# Patient Record
Sex: Male | Born: 1975 | Race: Black or African American | Hispanic: No | Marital: Single | Smoking: Current every day smoker
Health system: Southern US, Community
[De-identification: ages and names within clinical notes are randomized; demographics above are authoritative.]

## PROBLEM LIST (undated history)

## (undated) DIAGNOSIS — Z7289 Other problems related to lifestyle: Secondary | ICD-10-CM

## (undated) DIAGNOSIS — F319 Bipolar disorder, unspecified: Secondary | ICD-10-CM

## (undated) DIAGNOSIS — F4310 Post-traumatic stress disorder, unspecified: Secondary | ICD-10-CM

## (undated) HISTORY — PX: HIP SURGERY: SHX245

---

## 2015-05-05 ENCOUNTER — Encounter (HOSPITAL_COMMUNITY): Payer: Self-pay | Admitting: Emergency Medicine

## 2015-05-05 ENCOUNTER — Emergency Department (INDEPENDENT_AMBULATORY_CARE_PROVIDER_SITE_OTHER)
Admission: EM | Admit: 2015-05-05 | Discharge: 2015-05-05 | Disposition: A | Discharge status: Home / Self Care | Payer: PRIVATE HEALTH INSURANCE | Source: Home / Self Care | Attending: Family Medicine | Admitting: Family Medicine

## 2015-05-05 ENCOUNTER — Emergency Department (INDEPENDENT_AMBULATORY_CARE_PROVIDER_SITE_OTHER): Payer: PRIVATE HEALTH INSURANCE

## 2015-05-05 DIAGNOSIS — S63502A Unspecified sprain of left wrist, initial encounter: Secondary | ICD-10-CM
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
## 2015-05-05 NOTE — Discharge Instructions (Signed)
Wear splint and see orthopedist next week for recheck

## 2015-05-05 NOTE — ED Provider Notes (Signed)
CSN: [id number]     Arrival date & time 05/05/15  1642 History   First MD Initiated Contact with Patient 05/05/15 1722     Chief Complaint  Patient presents with  . Wrist Injury   (Consider location/radiation/quality/duration/timing/severity/associated sxs/prior Treatment) Patient is a 39 y.o. male presenting with wrist injury. The history is provided by the patient.  Wrist Injury Location:  Wrist Time since incident:  1 week Injury: yes   Mechanism of injury comment:  Fx in 11/2014, fell on trampoline and now with recurrent pain Wrist location:  L wrist   History reviewed. No pertinent past medical history. History reviewed. No pertinent past surgical history. History reviewed. No pertinent family history. Social History  Substance Use Topics  . Smoking status: Current Every Day Smoker -- 1.00 packs/day    Types: Cigarettes  . Smokeless tobacco: Never Used  . Alcohol Use: No    Review of Systems  Musculoskeletal: Negative for joint swelling.    Allergies  Review of patient's allergies indicates no known allergies.  Home Medications   Prior to Admission medications   Medication Sig Start Date End Date Taking? Authorizing Provider  acetaminophen (TYLENOL) 500 MG chewable tablet Chew 1,000 mg by mouth every 6 (six) hours as needed for pain.   Yes Historical Provider, MD   BP 142/88 mmHg  Pulse 60  Temp(Src) 99.1 F (37.3 C) (Oral)  Resp 18  SpO2 98% Physical Exam  Constitutional: He is oriented to person, place, and time. He appears well-developed and well-nourished. He appears distressed.  Musculoskeletal: He exhibits tenderness.       Left wrist: He exhibits decreased range of motion, tenderness, bony tenderness and swelling. He exhibits no deformity.  Neurological: He is alert and oriented to person, place, and time.  Skin: Skin is warm and dry.  Nursing note and vitals reviewed.   ED Course  Procedures (including critical care time) Labs Review Labs  Reviewed - No data to display  Imaging Review Dg Wrist Complete Left  05/05/2015   CLINICAL DATA:  History of remote trauma and acute trauma involving the left wrist.  EXAM: LEFT WRIST - COMPLETE 3+ VIEW  COMPARISON:  None.  FINDINGS: There is a healed or healing fracture of the distal radius. There is also a an ununited ulnar styloid fracture. No obvious acute fracture. The carpal bones are intact. The intercarpal joint spaces are maintained.  IMPRESSION: Healed or healing distal radius fracture and ununited ulnar styloid fracture.   Electronically Signed   By: Rudie Meyer M.D.   On: 05/05/2015 17:53     MDM   1. Sprain of wrist, left, initial encounter        Linna Hoff, MD 05/05/15 [phone]

## 2015-05-10 ENCOUNTER — Emergency Department (HOSPITAL_COMMUNITY): Payer: Medicaid - Out of State

## 2015-05-10 ENCOUNTER — Encounter (HOSPITAL_COMMUNITY): Payer: Self-pay | Admitting: Nurse Practitioner

## 2015-05-10 ENCOUNTER — Emergency Department (HOSPITAL_COMMUNITY)
Admission: EM | Admit: 2015-05-10 | Discharge: 2015-05-10 | Disposition: A | Discharge status: Home / Self Care | Payer: Medicaid - Out of State | Attending: Emergency Medicine | Admitting: Emergency Medicine

## 2015-05-10 DIAGNOSIS — Z720 Tobacco use: Secondary | ICD-10-CM | POA: Diagnosis not present

## 2015-05-10 DIAGNOSIS — M25532 Pain in left wrist: Secondary | ICD-10-CM | POA: Diagnosis not present

## 2015-05-10 MED ORDER — NAPROXEN 250 MG PO TABS: 250.0000 mg | ORAL_TABLET | Freq: Two times a day (BID) | ORAL | Status: DC

## 2015-05-10 NOTE — Discharge Instructions (Signed)
Wrist Pain °Wrist injuries are frequent in adults and children. A sprain is an injury to the ligaments that hold your bones together. A strain is an injury to muscle or muscle cord-like structures (tendons) from stretching or pulling. Generally, when wrists are moderately tender to touch following a fall or injury, a break in the bone (fracture) may be present. Most wrist sprains or strains are better in 3 to 5 days, but complete healing may take several weeks. °HOME CARE INSTRUCTIONS  °· Put ice on the injured area. °· Put ice in a plastic bag. °· Place a towel between your skin and the bag. °· Leave the ice on for 15-20 minutes, 3-4 times a day, for the first 2 days, or as directed by your health care provider. °· Keep your arm raised above the level of your heart whenever possible to reduce swelling and pain. °· Rest the injured area for at least 48 hours or as directed by your health care provider. °· If a splint or elastic bandage has been applied, use it for as long as directed by your health care provider or until seen by a health care provider for a follow-up exam. °· Only take over-the-counter or prescription medicines for pain, discomfort, or fever as directed by your health care provider. °· Keep all follow-up appointments. You may need to follow up with a specialist or have follow-up X-rays. Improvement in pain level is not a guarantee that you did not fracture a bone in your wrist. The only way to determine whether or not you have a broken bone is by X-ray. °SEEK IMMEDIATE MEDICAL CARE IF:  °· Your fingers are swollen, very red, white, or cold and blue. °· Your fingers are numb or tingling. °· You have increasing pain. °· You have difficulty moving your fingers. °MAKE SURE YOU:  °· Understand these instructions. °· Will watch your condition. °· Will get help right away if you are not doing well or get worse. °Document Released: 06/13/2005 Document Revised: 09/08/2013 Document Reviewed:  10/25/2010 °ExitCare® Patient Information ©2015 [employer]. This information is not intended to replace advice given to you by your health care provider. Make sure you discuss any questions you have with your health care provider. °Wrist Splint °A wrist splint is a brace that holds your wrist in a fixed position. It can be used to stabilize your wrist so that broken bones and sprains can heal faster, with less pain. It can also help to relieve pressure on the nerve that runs down the middle of your arm (median nerve). °Splints are available in drugstores without a prescription. They are also available by prescription from orthopedic and medical supply stores. Custom splints made from lightweight materials can be made by physical or occupational therapist. °HOME CARE INSTRUCTIONS °· Wear your splint as instructed by your caregiver. It may be worn while you sleep. °· Your caregiver may instruct you how to perform certain exercises at home. These exercises help maintain muscle strength in your hand and wrist. They also help to maintain motion in your fingers. °SEEK MEDICAL CARE IF: °· You start to lose feeling in your hand or fingers. °· Your skin or fingernails turn blue or gray, or they feel cold. °MAKE SURE YOU:  °· Understand these instructions. °· Will watch your condition. °· Will get help right away if you are not doing well or get worse. °Document Released: 08/16/2006 Document Revised: 11/26/2011 Document Reviewed: 12/15/2013 °ExitCare® Patient Information ©2015 [employer]. This information is   not intended to replace advice given to you by your health care provider. Make sure you discuss any questions you have with your health care provider. ° °

## 2015-05-10 NOTE — ED Notes (Signed)
Dr. Gramig in to see pt 

## 2015-05-10 NOTE — ED Notes (Signed)
He was seen at [facility] earlier this week for L wrist fx, referred to Dr Amanda Pea for follow up. He can not go there due to insurance. He called dr Brunetta Genera office and they instructed him to come to ED for further management of injury
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
## 2015-11-07 ENCOUNTER — Emergency Department (HOSPITAL_COMMUNITY)
Admission: EM | Admit: 2015-11-07 | Discharge: 2015-11-08 | Disposition: A | Discharge status: Home / Self Care | Payer: PRIVATE HEALTH INSURANCE | Attending: Emergency Medicine | Admitting: Emergency Medicine

## 2015-11-07 ENCOUNTER — Encounter (HOSPITAL_COMMUNITY): Payer: Self-pay

## 2015-11-07 DIAGNOSIS — F1210 Cannabis abuse, uncomplicated: Secondary | ICD-10-CM | POA: Insufficient documentation

## 2015-11-07 DIAGNOSIS — F329 Major depressive disorder, single episode, unspecified: Secondary | ICD-10-CM | POA: Insufficient documentation

## 2015-11-07 DIAGNOSIS — F1994 Other psychoactive substance use, unspecified with psychoactive substance-induced mood disorder: Secondary | ICD-10-CM | POA: Diagnosis not present

## 2015-11-07 DIAGNOSIS — F1910 Other psychoactive substance abuse, uncomplicated: Secondary | ICD-10-CM

## 2015-11-07 DIAGNOSIS — F1410 Cocaine abuse, uncomplicated: Secondary | ICD-10-CM | POA: Diagnosis not present

## 2015-11-07 DIAGNOSIS — F17210 Nicotine dependence, cigarettes, uncomplicated: Secondary | ICD-10-CM | POA: Insufficient documentation

## 2015-11-07 DIAGNOSIS — F32A Depression, unspecified: Secondary | ICD-10-CM

## 2015-11-07 DIAGNOSIS — Z008 Encounter for other general examination: Secondary | ICD-10-CM | POA: Diagnosis present

## 2015-11-07 HISTORY — DX: Other problems related to lifestyle: Z72.89

## 2015-11-07 HISTORY — DX: Post-traumatic stress disorder, unspecified: F43.10

## 2015-11-07 HISTORY — DX: Bipolar disorder, unspecified: F31.9

## 2015-11-07 LAB — CBC
HEMATOCRIT: 45.4 % (ref 39.0–52.0)
HEMOGLOBIN: 14.6 g/dL (ref 13.0–17.0)
MCH: 28.7 pg (ref 26.0–34.0)
MCHC: 32.2 g/dL (ref 30.0–36.0)
MCV: 89.4 fL (ref 78.0–100.0)
Platelets: 199 10*3/uL (ref 150–400)
RBC: 5.08 MIL/uL (ref 4.22–5.81)
RDW: 13.3 % (ref 11.5–15.5)
WBC: 4.7 10*3/uL (ref 4.0–10.5)

## 2015-11-07 LAB — ETHANOL: Alcohol, Ethyl (B): <5 mg/dL (ref <5)

## 2015-11-07 LAB — SALICYLATE LEVEL: Salicylate Lvl: <4 mg/dL (ref 2.8–30.0)

## 2015-11-07 LAB — RAPID URINE DRUG SCREEN, HOSP PERFORMED
AMPHETAMINES: NOT DETECTED
BARBITURATES: NOT DETECTED
Benzodiazepines: NOT DETECTED
Cocaine: POSITIVE — AB
Opiates: NOT DETECTED
TETRAHYDROCANNABINOL: POSITIVE — AB

## 2015-11-07 LAB — COMPREHENSIVE METABOLIC PANEL
ALK PHOS: 50 U/L (ref 38–126)
ALT: 17 U/L (ref 17–63)
AST: 18 U/L (ref 15–41)
Albumin: 4.1 g/dL (ref 3.5–5.0)
Anion gap: 9 (ref 5–15)
BUN: 17 mg/dL (ref 6–20)
CALCIUM: 9.3 mg/dL (ref 8.9–10.3)
CHLORIDE: 107 mmol/L (ref 101–111)
CO2: 26 mmol/L (ref 22–32)
CREATININE: 1.17 mg/dL (ref 0.61–1.24)
Glucose, Bld: 101 mg/dL — ABNORMAL HIGH (ref 65–99)
Potassium: 3.9 mmol/L (ref 3.5–5.1)
SODIUM: 142 mmol/L (ref 135–145)
Total Bilirubin: 0.6 mg/dL (ref 0.3–1.2)
Total Protein: 7.3 g/dL (ref 6.5–8.1)

## 2015-11-07 LAB — ACETAMINOPHEN LEVEL: Acetaminophen (Tylenol), Serum: <10 ug/mL — ABNORMAL LOW (ref 10–30)

## 2015-11-07 MED ORDER — ONDANSETRON HCL 4 MG PO TABS: 4.0000 mg | ORAL_TABLET | Freq: Three times a day (TID) | ORAL | Status: DC | PRN

## 2015-11-07 MED ORDER — LORAZEPAM 1 MG PO TABS: 1.0000 mg | ORAL_TABLET | Freq: Three times a day (TID) | ORAL | Status: DC | PRN

## 2015-11-07 MED ORDER — IBUPROFEN 200 MG PO TABS: 600.0000 mg | ORAL_TABLET | Freq: Three times a day (TID) | ORAL | Status: DC | PRN

## 2015-11-07 NOTE — ED Notes (Signed)
Glasses with patient

## 2015-11-07 NOTE — ED Notes (Signed)
Bed: [facility] Expected date: 11/07/15 Expected time: 9:52 PM Means of arrival:  Comments: Triage 6

## 2015-11-07 NOTE — ED Notes (Addendum)
According to representative with [facility], pt has stated within the last 24hr. Impulsive SI and HI. Pt denies a specific SI plan.  Pt recently experienced loss of his twin children. Pt reports hearing voices instructing him to hurt other individuals. Pt is A+OX4, speaking in complete sentences, calm and cooperative. Pt reports consuming Marijuana, MDMA, Cocaine within the last 48hrs.

## 2015-11-07 NOTE — ED Provider Notes (Signed)
CSN: [id number]     Arrival date & time 11/07/15  1856 History   First MD Initiated Contact with Patient 11/07/15 2027     Chief Complaint  Patient presents with  . Psychiatric Evaluation     (Consider location/radiation/quality/duration/timing/severity/associated sxs/prior Treatment) HPI   40 year old male who presented for psychiatric evaluation. Multiple complaints. Patient reports he was incarcerated in [place]. During his incarceration, his sisters died and one of his other brothers was also incarcerated for a prolonged period of time. Both were individuals he relied on for emotional support.  He also reports he was going to have twin boys but they died prematurely. He moved down to [place] about 3 weeks ago to be with other family members. Living with his twin brother, but he has a tumultuous relationship with him. He feels like he has been becoming increasingly out of control. Will get angry with very little provocation. Self-medicating with cocaine, marijuana and Molly. Denies alcohol use. Reports a past history of depression and bipolar disorder. Previously on medications when getting care in [place] and [place]. He has no local mental health care. Denies SI or HI to me, but reports that he can be very impulsive at times and is afraid he may potentially harm someone or himself if he doesn't get help.    Past Medical History  Diagnosis Date  . Bipolar disorder (HCC)   . Self-mutilation   . PTSD (post-traumatic stress disorder)    Past Surgical History  Procedure Laterality Date  . Hip surgery     No family history on file. Social History  Substance Use Topics  . Smoking status: Current Every Day Smoker -- 1.00 packs/day    Types: Cigarettes  . Smokeless tobacco: Never Used  . Alcohol Use: No    Review of Systems  All systems reviewed and negative, other than as noted in HPI.   Allergies  Review of patient's allergies indicates no known allergies.  Home  Medications   Prior to Admission medications   Medication Sig Start Date End Date Taking? Authorizing Provider  naproxen (NAPROSYN) 250 MG tablet Take 1 tablet (250 mg total) by mouth 2 (two) times daily with a meal. Patient not taking: Reported on 11/07/2015 05/10/15   Everlene Farrier, PA-C   BP 134/92 mmHg  Pulse 68  Temp(Src) 98.2 F (36.8 C) (Oral)  Resp 15  SpO2 98% Physical Exam  Constitutional: He appears well-developed and well-nourished. No distress.  HENT:  Head: Normocephalic and atraumatic.  Eyes: Conjunctivae are normal. Right eye exhibits no discharge. Left eye exhibits no discharge.  Neck: Neck supple.  Cardiovascular: Normal rate, regular rhythm and normal heart sounds.  Exam reveals no gallop and no friction rub.   No murmur heard. Pulmonary/Chest: Effort normal and breath sounds normal. No respiratory distress.  Abdominal: Soft. He exhibits no distension. There is no tenderness.  Musculoskeletal: He exhibits no edema or tenderness.  Neurological: He is alert.  Skin: Skin is warm and dry.  Psychiatric: He has a normal mood and affect. His behavior is normal. Thought content normal.  Nursing note and vitals reviewed.   ED Course  Procedures (including critical care time) Labs Review Labs Reviewed  COMPREHENSIVE METABOLIC PANEL - Abnormal; Notable for the following:    Glucose, Bld 101 (*)    All other components within normal limits  ACETAMINOPHEN LEVEL - Abnormal; Notable for the following:    Acetaminophen (Tylenol), Serum <10 (*)    All other components within  normal limits  URINE RAPID DRUG SCREEN, HOSP PERFORMED - Abnormal; Notable for the following:    Cocaine POSITIVE (*)    Tetrahydrocannabinol POSITIVE (*)    All other components within normal limits  ETHANOL  SALICYLATE LEVEL  CBC    Imaging Review No results found. I have personally reviewed and evaluated these images and lab results as part of my medical decision-making.   EKG  Interpretation None      MDM   Final diagnoses:  Depression  Drug abuse    40 year old male with past history of depression and bipolar disorder currently not under any active psychiatric care. Self-medicating with various drugs. Increasingly depressed. Denies hallucinations, specific suicidal or homicidal ideation but reports he can be very impulsive at times and is afraid he may potentially harm himself or someone else we doesn't get help.    Raeford Razor, MD 11/07/15 2139

## 2015-11-07 NOTE — ED Notes (Signed)
Patient appears suspicious. Denies SI, HI, AVH.   Oriented to the unit.   Q 15 safety checks continue.
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
## 2015-11-08 DIAGNOSIS — F32A Depression, unspecified: Secondary | ICD-10-CM | POA: Insufficient documentation

## 2015-11-08 DIAGNOSIS — F1994 Other psychoactive substance use, unspecified with psychoactive substance-induced mood disorder: Secondary | ICD-10-CM | POA: Diagnosis present

## 2015-11-08 DIAGNOSIS — F1210 Cannabis abuse, uncomplicated: Secondary | ICD-10-CM | POA: Diagnosis not present

## 2015-11-08 DIAGNOSIS — F1910 Other psychoactive substance abuse, uncomplicated: Secondary | ICD-10-CM | POA: Diagnosis not present

## 2015-11-08 DIAGNOSIS — F329 Major depressive disorder, single episode, unspecified: Secondary | ICD-10-CM | POA: Diagnosis not present

## 2015-11-08 NOTE — BH Assessment (Signed)
BHH Assessment Progress Note  Per Thedore Mins, MD, this pt does not require psychiatric hospitalization at this time. Pt is to be discharged from [facility] with referral information for area substance abuse treatment programs. Discharge instructions advise pt to follow up with [facility], [facility], [facility], or [facility] for residential treatment; or with [facility], [facility] or the [facility] for outpatient treatment. Pt's nurse, Kendal Hymen, has been notified.  Doylene Canning, MA Triage Specialist [phone]

## 2015-11-08 NOTE — Discharge Instructions (Signed)
RESIDENTIAL PROGRAMS:       [facility]      [address] [place], [place] [address]      [phone]       [facility]      [address] [place], [place] [address]      [phone]       [facility] [address]      [address]      [place], [place] [address]       [phone]       [facility]      [address]      [place], [place] [address]       [phone]       [facility]      [address]      [place], [place] [address]      [phone]  OUTPATIENT PROGRAMS:       [facility]      [address]      [place], [place] [address]      [phone]      New patients are seen at the walk-in clinic every Tuesday from 9:00 am - 12:00 pm.        [facility] [place]      [address]      [place], [place] [address]      [phone]      New patients are seen at their walk-in clinic.  Walk-in hours are Monday - Friday from 8:00 am - 12:00 pm, and from 1:00 pm - 3:00 pm.  Walk-in patients are seen on a first come, first served basis, so try to arrive as early as possible for the best chance of being seen the same day.  There is an initial fee of $22.50       [facility]      [address] [place], [place] [address]      [phone]
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
## 2015-11-08 NOTE — ED Notes (Signed)
Pt discharged ambulatory with resources.  All belongings were returned to patient.

## 2015-11-08 NOTE — [facility] Suicide Risk Assessment (Cosign Needed)
Suicide Risk Assessment  Discharge Assessment   [facility] Discharge Suicide Risk Assessment   Principal Problem: Substance induced mood disorder [facility]) Discharge Diagnoses:  Patient Active Problem List   Diagnosis Date Noted  . Cannabis abuse, uncomplicated [F12.10] 11/08/2015    Priority: High  . Marijuana abuse [F12.10] 11/08/2015    Priority: High  . Substance induced mood disorder (H[facility]) [F19.94] 11/08/2015    Priority: High  . Depression [F32.9]   . Drug abuse [F19.10]     Total Time spent with patient: 45 minutes  Musculoskeletal: Strength & Muscle Tone: within normal limits Gait & Station: normal Patient leans: N/A  Psychiatric Specialty Exam:   Blood pressure 133/88, pulse 64, temperature 98 F (36.7 C), temperature source Oral, resp. rate 18, SpO2 94 %.There is no height or weight on file to calculate BMI.  General Appearance: Casual and Fairly Groomed  Patent attorney:: Good  Speech: Clear and Coherent and Normal Rate  Volume: Normal  Mood: Anxious  Affect: Congruent  Thought Process: Coherent, Goal Directed and Intact  Orientation: Full (Time, Place, and Person)  Thought Content: WDL  Suicidal Thoughts: No  Homicidal Thoughts: No  Memory: Immediate; Good Recent; Good Remote; Good  Judgement: Good  Insight: Good  Psychomotor Activity: Normal  Concentration: Good  Recall: NA  Fund of Knowledge:Good  Language: Good  Akathisia: NA  Handed: Right  AIMS (if indicated):    Assets: Desire for Improvement  ADL's: Intact  Cognition: WNL         Mental Status Per Nursing Assessment::   On Admission:     Demographic Factors:  Male  Loss Factors: NA  Historical Factors: NA  Risk Reduction Factors:   Living with another person, especially a relative  Continued Clinical Symptoms:  Severe Anxiety and/or Agitation Depression:   Insomnia Alcohol/Substance Abuse/Dependencies  Cognitive Features That  Contribute To Risk:  Polarized thinking    Suicide Risk:  Minimal: No identifiable suicidal ideation.  Patients presenting with no risk factors but with morbid ruminations; may be classified as minimal risk based on the severity of the depressive symptoms    Plan Of Care/Follow-up recommendations:  Activity:  as tolerated Diet:  Regular  Earney Navy, NP    PMHNP-BC 11/08/2015, 12:01 PM

## 2016-05-04 ENCOUNTER — Ambulatory Visit (HOSPITAL_COMMUNITY)
Admission: RE | Admit: 2016-05-04 | Discharge: 2016-05-04 | Disposition: A | Discharge status: Home / Self Care | Payer: Medicaid - Out of State | Attending: Psychiatry | Admitting: Psychiatry

## 2016-05-04 ENCOUNTER — Emergency Department (HOSPITAL_COMMUNITY)
Admission: EM | Admit: 2016-05-04 | Discharge: 2016-05-07 | Disposition: A | Discharge status: Home / Self Care | Payer: PRIVATE HEALTH INSURANCE | Attending: Emergency Medicine | Admitting: Emergency Medicine

## 2016-05-04 ENCOUNTER — Encounter (HOSPITAL_COMMUNITY): Payer: Self-pay

## 2016-05-04 DIAGNOSIS — F1919 Other psychoactive substance abuse with unspecified psychoactive substance-induced disorder: Secondary | ICD-10-CM | POA: Insufficient documentation

## 2016-05-04 DIAGNOSIS — F4312 Post-traumatic stress disorder, chronic: Secondary | ICD-10-CM | POA: Insufficient documentation

## 2016-05-04 DIAGNOSIS — F1414 Cocaine abuse with cocaine-induced mood disorder: Secondary | ICD-10-CM | POA: Diagnosis present

## 2016-05-04 DIAGNOSIS — F1290 Cannabis use, unspecified, uncomplicated: Secondary | ICD-10-CM | POA: Insufficient documentation

## 2016-05-04 DIAGNOSIS — F319 Bipolar disorder, unspecified: Secondary | ICD-10-CM | POA: Diagnosis not present

## 2016-05-04 DIAGNOSIS — R45851 Suicidal ideations: Secondary | ICD-10-CM

## 2016-05-04 DIAGNOSIS — Z5181 Encounter for therapeutic drug level monitoring: Secondary | ICD-10-CM | POA: Insufficient documentation

## 2016-05-04 DIAGNOSIS — F17210 Nicotine dependence, cigarettes, uncomplicated: Secondary | ICD-10-CM | POA: Insufficient documentation

## 2016-05-04 DIAGNOSIS — F1910 Other psychoactive substance abuse, uncomplicated: Secondary | ICD-10-CM

## 2016-05-04 DIAGNOSIS — F329 Major depressive disorder, single episode, unspecified: Secondary | ICD-10-CM | POA: Insufficient documentation

## 2016-05-04 LAB — CBC WITH DIFFERENTIAL/PLATELET
Basophils Absolute: 0 10*3/uL (ref 0.0–0.1)
Basophils Relative: 0 %
Eosinophils Absolute: 0.1 10*3/uL (ref 0.0–0.7)
Eosinophils Relative: 2 %
HEMATOCRIT: 45.2 % (ref 39.0–52.0)
HEMOGLOBIN: 15.7 g/dL (ref 13.0–17.0)
LYMPHS ABS: 3.4 10*3/uL (ref 0.7–4.0)
LYMPHS PCT: 54 %
MCH: 30 pg (ref 26.0–34.0)
MCHC: 34.7 g/dL (ref 30.0–36.0)
MCV: 86.3 fL (ref 78.0–100.0)
Monocytes Absolute: 0.8 10*3/uL (ref 0.1–1.0)
Monocytes Relative: 12 %
Neutro Abs: 2 10*3/uL (ref 1.7–7.7)
Neutrophils Relative %: 32 %
Platelets: 196 10*3/uL (ref 150–400)
RBC: 5.24 MIL/uL (ref 4.22–5.81)
RDW: 14 % (ref 11.5–15.5)
WBC: 6.3 10*3/uL (ref 4.0–10.5)

## 2016-05-04 LAB — I-STAT CHEM 8, ED
BUN: 16 mg/dL (ref 6–20)
CALCIUM ION: 1.14 mmol/L (ref 1.13–1.30)
CHLORIDE: 103 mmol/L (ref 101–111)
Creatinine, Ser: 1 mg/dL (ref 0.61–1.24)
GLUCOSE: 114 mg/dL — AB (ref 65–99)
HCT: 46 % (ref 39.0–52.0)
Hemoglobin: 15.6 g/dL (ref 13.0–17.0)
Potassium: 3.9 mmol/L (ref 3.5–5.1)
SODIUM: 143 mmol/L (ref 135–145)
TCO2: 26 mmol/L (ref 0–100)

## 2016-05-04 LAB — RAPID URINE DRUG SCREEN, HOSP PERFORMED
AMPHETAMINES: NOT DETECTED
BARBITURATES: NOT DETECTED
Benzodiazepines: NOT DETECTED
Cocaine: POSITIVE — AB
OPIATES: NOT DETECTED
TETRAHYDROCANNABINOL: POSITIVE — AB

## 2016-05-04 LAB — ETHANOL: Alcohol, Ethyl (B): <5 mg/dL (ref <5)

## 2016-05-04 MED ORDER — ACETAMINOPHEN 325 MG PO TABS: 650.0000 mg | ORAL_TABLET | ORAL | Status: DC | PRN

## 2016-05-04 MED ORDER — IBUPROFEN 200 MG PO TABS: 600.0000 mg | ORAL_TABLET | Freq: Three times a day (TID) | ORAL | Status: DC | PRN

## 2016-05-04 MED ORDER — NICOTINE 21 MG/24HR TD PT24
21.0000 mg | MEDICATED_PATCH | Freq: Every day | TRANSDERMAL | Status: DC
  Filled 2016-05-04: qty 1

## 2016-05-04 MED ORDER — ALUM & MAG HYDROXIDE-SIMETH 200-200-20 MG/5ML PO SUSP: 30.0000 mL | ORAL | Status: DC | PRN

## 2016-05-04 NOTE — ED Provider Notes (Signed)
[facility]-EMERGENCY DEPT Provider Note   CSN: [id number] Arrival date & time: 05/04/16  1640     History   Chief Complaint Chief Complaint  Patient presents with  . Suicidal    x 3 WEEKS    HPI Donald Chung is a 40 y.o. male.  Patient is a 40 year old male with a history of self-mutilation, PTSD and bipolar disease who currently takes no medications presenting today with a month of worsening suicidal and homicidal thoughts. He has been using alcohol and drugs to medicate.  Over the last few weeks the suicidal ideations are getting worse. He uses drugs and alcohol hoping he will wake up. He also thinks about killing people who deserve it. He occasionally has visual and auditory hallucinations but no command hallucinations. He denies any recent cutting. He does have access to weapons but currently did not shoot himself.  He states he wants to get better but he knows without help and medications he will not. The last time he was on any medications was approximately 4 years ago.   The history is provided by the patient.    Past Medical History:  Diagnosis Date  . Bipolar disorder (HCC)   . PTSD (post-traumatic stress disorder)   . Self-mutilation     Patient Active Problem List   Diagnosis Date Noted  . Cannabis abuse, uncomplicated 11/08/2015  . Marijuana abuse 11/08/2015  . Substance induced mood disorder (HCC) 11/08/2015  . Depression   . Drug abuse     Past Surgical History:  Procedure Laterality Date  . HIP SURGERY         Home Medications    Prior to Admission medications   Not on File    Family History History reviewed. No pertinent family history.  Social History Social History  Substance Use Topics  . Smoking status: Current Every Day Smoker    Packs/day: 1.00    Types: Cigarettes  . Smokeless tobacco: Never Used  . Alcohol use No     Allergies   Review of patient's allergies indicates no known allergies.   Review of Systems Review of Systems    All other systems reviewed and are negative.    Physical Exam Updated Vital Signs BP 93/76 (BP Location: Right Arm)   Pulse 66   Temp 98.3 F (36.8 C) (Oral)   Resp 20   Ht 6' (1.829 m)   Wt 185 lb (83.9 kg)   SpO2 100%   BMI 25.09 kg/m   Physical Exam  Constitutional: He is oriented to person, place, and time. He appears well-developed and well-nourished. No distress.  HENT:  Head: Normocephalic and atraumatic.  Mouth/Throat: Oropharynx is clear and moist.  Eyes: Conjunctivae and EOM are normal. Pupils are equal, round, and reactive to light.  Neck: Normal range of motion. Neck supple.  Cardiovascular: Normal rate, regular rhythm and intact distal pulses.   No murmur heard. Pulmonary/Chest: Effort normal and breath sounds normal. No respiratory distress. He has no wheezes. He has no rales.  Abdominal: Soft. He exhibits no distension. There is no tenderness. There is no rebound and no guarding.  Musculoskeletal: Normal range of motion. He exhibits no edema or tenderness.  Neurological: He is alert and oriented to person, place, and time.  Skin: Skin is warm and dry. No rash noted. No erythema.  Psychiatric: His speech is normal and behavior is normal. Cognition and memory are normal. He expresses impulsivity. He exhibits a depressed mood. He expresses homicidal and suicidal ideation. He  expresses suicidal plans and homicidal plans.  Nursing note and vitals reviewed.    ED Treatments / Results  Labs (all labs ordered are listed, but only abnormal results are displayed) Labs Reviewed  CBC WITH DIFFERENTIAL/PLATELET  ETHANOL  URINE RAPID DRUG SCREEN, HOSP PERFORMED  I-STAT CHEM 8, ED    EKG  EKG Interpretation None       Radiology No results found.  Procedures Procedures (including critical care time)  Medications Ordered in ED Medications  ibuprofen (ADVIL,MOTRIN) tablet 600 mg (not administered)  acetaminophen (TYLENOL) tablet 650 mg (not administered)   nicotine (NICODERM CQ - dosed in mg/24 hours) patch 21 mg (not administered)  alum & mag hydroxide-simeth (MAALOX/MYLANTA) 200-200-20 MG/5ML suspension 30 mL (not administered)     Initial Impression / Assessment and Plan / ED Course  I have reviewed the triage vital signs and the nursing notes.  Pertinent labs & imaging results that were available during my care of the patient were reviewed by me and considered in my medical decision making (see chart for details).  Clinical Course    Patient is a 40 year old male with a psychiatric history who is currently not on medications presenting with suicidal and homicidal thoughts intermittently. He states he used numerous drugs and alcohol last night just hoping he would not wake up. He states he needs help and he is not going to get better unless he starts medications. He has not been on medications for some time but states approximately 4 years ago he was on medication that helped him. He does have access to a weapon and is scared he may use it on people who deserve it. He is currently calm and cooperative. He has not had any alcohol since last night and only use marijuana this morning. He has no other complaints at this time. Labs pending the patient appears medically clear. Will have TTS evaluate  Final Clinical Impressions(s) / ED Diagnoses   Final diagnoses:  Suicidal ideation  Depression  Polysubstance abuse    New Prescriptions New Prescriptions   No medications on file     Gwyneth SproutWhitney Denney Shein, MD 05/04/16 1746

## 2016-05-04 NOTE — ED Triage Notes (Addendum)
PT STS SUICIDAL/HOMICIDAL THOUGHTS X3 WEEKS. PT STS HE TOOK "A LOT" OF LEAN, MOLLY, AND ALCOHOL LAST NIGHT TO KILL HIMSELF, BUT IT DID NOT WORK. PT STS HE HAS A "SELECT" GROUP OF PEOPLE THE HE WOULD LIKE TO HURT. PT STS HE SUFFERS FROM PTSD, BIPOLAR, AND DEPRESSION.
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
## 2016-05-04 NOTE — ED Notes (Signed)
PT IS REFUSING TO REMOVE HIS UNDER SHIRT.

## 2016-05-05 DIAGNOSIS — F4310 Post-traumatic stress disorder, unspecified: Secondary | ICD-10-CM | POA: Diagnosis not present

## 2016-05-05 DIAGNOSIS — Z79899 Other long term (current) drug therapy: Secondary | ICD-10-CM

## 2016-05-05 DIAGNOSIS — F3130 Bipolar disorder, current episode depressed, mild or moderate severity, unspecified: Secondary | ICD-10-CM | POA: Diagnosis not present

## 2016-05-05 DIAGNOSIS — F1414 Cocaine abuse with cocaine-induced mood disorder: Secondary | ICD-10-CM | POA: Diagnosis not present

## 2016-05-05 DIAGNOSIS — F1210 Cannabis abuse, uncomplicated: Secondary | ICD-10-CM | POA: Diagnosis not present

## 2016-05-05 DIAGNOSIS — F17210 Nicotine dependence, cigarettes, uncomplicated: Secondary | ICD-10-CM

## 2016-05-05 MED ORDER — LORAZEPAM 1 MG PO TABS: 1.0000 mg | ORAL_TABLET | Freq: Four times a day (QID) | ORAL | Status: DC | PRN

## 2016-05-05 MED ORDER — QUETIAPINE FUMARATE 25 MG PO TABS
25.0000 mg | ORAL_TABLET | Freq: Three times a day (TID) | ORAL | Status: DC
  Administered 2016-05-05 – 2016-05-06 (×4): 25 mg via ORAL
  Filled 2016-05-05 (×5): qty 1

## 2016-05-05 NOTE — ED Notes (Signed)
Patient noted sleeping in room. No complaints, stable, in no acute distress. Q15 minute rounds and monitoring via Security Cameras to continue.  

## 2016-05-05 NOTE — ED Notes (Signed)
[facility]

## 2016-05-05 NOTE — ED Notes (Signed)
Report called to [facility] 

## 2016-05-05 NOTE — ED Notes (Signed)
Patient noted in room. No complaints, stable, in no acute distress. Q15 minute rounds and monitoring via Security Cameras to continue.  

## 2016-05-05 NOTE — ED Notes (Deleted)
Pt belongings secured in [address]OCK[address]. 
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
## 2016-05-05 NOTE — ED Notes (Signed)
Report to include situation, background, assessment and recommendations from Karen Shugart RN. Patient sleeping, respirations regular and unlabored. Q15 minute rounds and security camera observation to continue.   

## 2016-05-05 NOTE — ED Notes (Signed)
Donald Chung was guarded when he arrived on the unit. He was cooperative, but when an older, confused pt began wandering in his room he grew angry and threatening. He appeared to have capacity to understand why he should give the other patient latitude/compassion, but he chose not to.

## 2016-05-06 NOTE — ED Notes (Signed)
Report to include situation, background, assessment and recommendations from Janie Rambo RN. Patient sleeping, respirations regular and unlabored. Q15 minute rounds and security camera observation to continue.   

## 2016-05-06 NOTE — ED Notes (Signed)
Patient noted sleeping in room. No complaints, stable, in no acute distress. Q15 minute rounds and monitoring via Security Cameras to continue.  

## 2016-05-06 NOTE — BHH Counselor (Signed)
Reassessment 05/06/2016:   Writer met with patient face to face to complete a TTS reassessment. Patient sts that he continues to have suicidal thoughts and has felt this way for several days. He continues to express thoughts of wanting to overdose on drugs. He denies having thoughts of wanting to hurt others. Admits that he did want to hurt others yesterday and had felt that way for some time. He did not initially elaborate on who he was having thoughts of wanting to hurt. Patient does speak of being under a lot of stress. Patient later expressed that his main stressor is family (specifically older brother). He does not get along with his brother and they both live in the same household. He also recently separated from his girlfriend and admits he had also thought of hurting her. Patient denies AVH's.   Patient continues meets criteria for INPT treatment, per Voncille Lo, NP and Dr. Louretta Shorten.

## 2016-05-06 NOTE — ED Notes (Signed)
Up to the bathroom 

## 2016-05-06 NOTE — ED Notes (Signed)
Double portions for meals VORB May NP 

## 2016-05-06 NOTE — ED Notes (Signed)
Up on the phone 

## 2016-05-06 NOTE — Progress Notes (Addendum)
Disposition CSW completed patient referrals to the following inpatient psych facilties:  [facility]  CSW will continue to follow patient for placement needs.  Seward SpeckLeo Aoi Kouns [facility]SW,LCAS Behavioral Health Disposition CSW [phone]

## 2016-05-06 NOTE — ED Notes (Signed)
On the phone 

## 2016-05-07 DIAGNOSIS — F1910 Other psychoactive substance abuse, uncomplicated: Secondary | ICD-10-CM | POA: Diagnosis not present

## 2016-05-07 DIAGNOSIS — F1414 Cocaine abuse with cocaine-induced mood disorder: Secondary | ICD-10-CM

## 2016-05-07 MED ORDER — QUETIAPINE FUMARATE 200 MG PO TABS: 200.0000 mg | ORAL_TABLET | Freq: Every day | ORAL | 0 refills | Status: AC

## 2016-05-07 MED ORDER — QUETIAPINE FUMARATE 100 MG PO TABS: 200.0000 mg | ORAL_TABLET | Freq: Every day | ORAL | Status: DC

## 2016-05-07 NOTE — ED Notes (Signed)
Patient noted sleeping in room. No complaints, stable, in no acute distress. Q15 minute rounds and monitoring via Security Cameras to continue.  

## 2016-05-07 NOTE — Consult Note (Signed)
[facility]   Reason for Consult:  Homicidal ideations Referring Physician:  EDP Patient Identification: Donald Chung MRN:  [id number] Principal Diagnosis: Cocaine abuse with cocaine-induced mood disorder (HCC) Diagnosis:   Patient Active Problem List   Diagnosis Date Noted  . Cocaine abuse with cocaine-induced mood disorder [facility](HCC) [F14.14] 05/07/2016    Priority: High  . Cannabis abuse, uncomplicated [F12.10] 11/08/2015  . Marijuana abuse [F12.10] 11/08/2015  . Drug abuse [F19.10]     Total Time spent with patient: 30 minutes  Subjective:   Donald Chung is a 40 y.o. male patient does not warrant admission.  HPI:  40 yo male who presented to the ED with homicidal ideations and [facility]  Today, he was not suicidal/homicidal or hallucinating.  Despite reporting multiple drug abuse, negative on admission except for cocaine.  Irritable and demanding of services.  Stable for discharge.   Past Psychiatric History: polysubstance abuse  Risk to Self: Is patient at risk for suicide?: Yes Risk to Others:  no Prior Inpatient Therapy:  [place] Prior Outpatient Therapy:  no  Past Medical History:  Past Medical History:  Diagnosis Date  . Bipolar disorder (HCC)   . PTSD (post-traumatic stress disorder)   . Self-mutilation     Past Surgical History:  Procedure Laterality Date  . HIP SURGERY     Family History: History reviewed. No pertinent family history. Family Psychiatric  History: noe Social History:  History  Alcohol Use No     History  Drug Use  . Frequency: 7.0 times per week  . Types: Marijuana    Social History   Social History  . Marital status: Single    Spouse name: N/A  . Number of children: N/A  . Years of education: N/A   Social History Main Topics  . Smoking status: Current Every Day Smoker    Packs/day: 1.00    Types: Cigarettes  . Smokeless tobacco: Never Used  . Alcohol use No  . Drug use:     Frequency:  7.0 times per week    Types: Marijuana  . Sexual activity: Not Asked   Other Topics Concern  . None   Social History Narrative  . None   Additional Social History:    Allergies:  No Known Allergies  Labs: No results found for this or any previous visit (from the past 48 hour(s)).  Current Facility-Administered Medications  Medication Dose Route Frequency Provider Last Rate Last Dose  . acetaminophen (TYLENOL) tablet 650 mg  650 mg Oral Q4H PRN Gwyneth SproutWhitney Plunkett, MD      . alum & mag hydroxide-simeth (MAALOX/MYLANTA) 200-200-20 MG/5ML suspension 30 mL  30 mL Oral PRN Gwyneth SproutWhitney Plunkett, MD      . ibuprofen (ADVIL,MOTRIN) tablet 600 mg  600 mg Oral Q8H PRN Gwyneth SproutWhitney Plunkett, MD      . nicotine (NICODERM CQ - dosed in mg/24 hours) patch 21 mg  21 mg Transdermal Daily Gwyneth SproutWhitney Plunkett, MD      . QUEtiapine (SEROQUEL) tablet 25 mg  25 mg Oral TID Leata MouseJanardhana Jonnalagadda, MD   25 mg at 05/06/16 2103   No current outpatient prescriptions on file.    Musculoskeletal: Strength & Muscle Tone: within normal limits Gait & Station: normal Patient leans: N/A  Psychiatric Specialty Exam: Physical Exam  Constitutional: He is oriented to person, place, and time. He appears well-developed and well-nourished.  HENT:  Head: Normocephalic.  Neck: Normal range of motion.  Respiratory: Effort normal.  Musculoskeletal: Normal range of motion.  Neurological: He is alert and oriented to person, place, and time.  Skin: Skin is warm and dry.  Psychiatric: He has a normal mood and affect. His speech is normal and behavior is normal. Judgment and thought content normal. Cognition and memory are normal.    Review of Systems  Constitutional: Negative.   HENT: Negative.   Eyes: Negative.   Respiratory: Negative.   Cardiovascular: Negative.   Gastrointestinal: Negative.   Genitourinary: Negative.   Musculoskeletal: Negative.   Skin: Negative.   Neurological: Negative.   Endo/Heme/Allergies:  Negative.   Psychiatric/Behavioral: Negative.     Blood pressure 119/63, pulse 77, temperature 98.3 F (36.8 C), temperature source Oral, resp. rate 18, height 6' (1.829 m), weight 83.9 kg (185 lb), SpO2 100 %.Body mass index is 25.09 kg/m.  General Appearance: Casual  Eye Contact:  Good  Speech:  Normal Rate  Volume:  Normal  Mood:  Irritable  Affect:  Congruent  Thought Process:  Coherent and Descriptions of Associations: Intact  Orientation:  Full (Time, Place, and Person)  Thought Content:  WDL  Suicidal Thoughts:  No  Homicidal Thoughts:  No  Memory:  Immediate;   Good Recent;   Good Remote;   Good  Judgement:  Fair  Insight:  Fair  Psychomotor Activity:  Normal  Concentration:  Concentration: Good and Attention Span: Good  Recall:  Good  Fund of Knowledge:  Good  Language:  Good  Akathisia:  No  Handed:  Right  AIMS (if indicated):     Assets:  Leisure Time Physical Health Resilience  ADL's:  Intact  Cognition:  WNL  Sleep:        Treatment Plan Summary: Daily contact with patient to assess and evaluate symptoms and progress in treatment, Medication management and Plan cocaine abuse with cocaine induced mood disorder:  -Crisis stabilization -Medication management:  Seroquel 25 mg TID changed to 200 mg at bedtime for mood and sleep. -Individual and substance abuse counseling  Disposition: No evidence of imminent risk to self or others at present.    Nanine MeansLORD, JAMISON, NP 05/07/2016 10:03 AM  Patient seen face-to-face for psychiatric evaluation, chart reviewed and case discussed with the physician extender and developed treatment plan. Reviewed the information documented and agree with the treatment plan. Thedore MinsMojeed Amelya Mabry, MD

## 2016-05-07 NOTE — Discharge Instructions (Signed)
For your ongoing mental health needs, you are advised to follow up with [facility].  New and returning patients are seen at their walk-in clinic.  Walk-in hours are Monday - Friday from 8:00 am - 3:00 pm.  Walk-in patients are seen on a first come, first served basis.  Try to arrive as early as possible for he best chance of being seen the same day: ° °     [facility] °     [address] °     [place], [place] [address] °     [phone] °

## 2016-05-07 NOTE — ED Notes (Signed)
Pt has been a little irritable, but cooperative. Denies SI/HI, is not delusional, denies AVH, not responding internal stimuli. All belongings returned to pt who signed for same, and pt escorted to the lobby.

## 2016-05-07 NOTE — BHH Suicide Risk Assessment (Signed)
Suicide Risk Assessment  Discharge Assessment   [facility]   Principal Problem: Cocaine abuse with cocaine-induced mood disorder [facility](HCC) Discharge Diagnoses:  Patient Active Problem List   Diagnosis Date Noted  . Cocaine abuse with cocaine-induced mood disorder [facility](HCC) [F14.14] 05/07/2016    Priority: High  . Polysubstance abuse [F19.10]   . Cannabis abuse, uncomplicated [F12.10] 11/08/2015  . Marijuana abuse [F12.10] 11/08/2015  . Drug abuse [F19.10]     Total Time spent with patient: 30 minutes  Musculoskeletal: Strength & Muscle Tone: within normal limits Gait & Station: normal Patient leans: N/A  Psychiatric Specialty Exam: Physical Exam  Constitutional: He is oriented to person, place, and time. He appears well-developed and well-nourished.  HENT:  Head: Normocephalic.  Neck: Normal range of motion.  Respiratory: Effort normal.  Musculoskeletal: Normal range of motion.  Neurological: He is alert and oriented to person, place, and time.  Skin: Skin is warm and dry.  Psychiatric: He has a normal mood and affect. His speech is normal and behavior is normal. Judgment and thought content normal. Cognition and memory are normal.    Review of Systems  Constitutional: Negative.   HENT: Negative.   Eyes: Negative.   Respiratory: Negative.   Cardiovascular: Negative.   Gastrointestinal: Negative.   Genitourinary: Negative.   Musculoskeletal: Negative.   Skin: Negative.   Neurological: Negative.   Endo/Heme/Allergies: Negative.   Psychiatric/Behavioral: Negative.     Blood pressure 119/63, pulse 77, temperature 98.3 F (36.8 C), temperature source Oral, resp. rate 18, height 6' (1.829 m), weight 83.9 kg (185 lb), SpO2 100 %.Body mass index is 25.09 kg/m.  General Appearance: Casual  Eye Contact:  Good  Speech:  Normal Rate  Volume:  Normal  Mood:  Irritable  Affect:  Congruent  Thought Process:  Coherent and Descriptions of Associations:  Intact  Orientation:  Full (Time, Place, and Person)  Thought Content:  WDL  Suicidal Thoughts:  No  Homicidal Thoughts:  No  Memory:  Immediate;   Good Recent;   Good Remote;   Good  Judgement:  Fair  Insight:  Fair  Psychomotor Activity:  Normal  Concentration:  Concentration: Good and Attention Span: Good  Recall:  Good  Fund of Knowledge:  Good  Language:  Good  Akathisia:  No  Handed:  Right  AIMS (if indicated):     Assets:  Leisure Time Physical Health Resilience  ADL's:  Intact  Cognition:  WNL  Sleep:      Mental Status Per Nursing Assessment::   On Admission:   homicidal ideations  Demographic Factors:  Male and Living alone  Loss Factors: NA  Historical Factors: NA  Risk Reduction Factors:   NA  Continued Clinical Symptoms:  Irritability   Cognitive Features That Contribute To Risk:  None    Suicide Risk:  Minimal: No identifiable suicidal ideation.  Patients presenting with no risk factors but with morbid ruminations; may be classified as minimal risk based on the severity of the depressive symptoms    Plan Of Care/Follow-up recommendations:  Activity:  as tolerated  Diet:  heart healthy diet  Kasmira Cacioppo, NP 05/07/2016, 2:32 PM

## 2016-05-07 NOTE — BH Assessment (Signed)
BHH Assessment Progress Note  Per Mojeed Akintayo, MD, this pt does not require psychiatric hospitalization at this time.  Pt is to be discharged from [facility] with recommendation to follow up with [facility].  This has been included in pt's discharge instructions.  Pt's nurse, Diane, has been notified.  Avni Traore, MA Triage Specialist [phone]     
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
## 2016-10-17 ENCOUNTER — Emergency Department (HOSPITAL_COMMUNITY)
Admission: EM | Admit: 2016-10-17 | Discharge: 2016-10-17 | Disposition: A | Discharge status: Home / Self Care | Payer: Medicaid - Out of State | Attending: Emergency Medicine | Admitting: Emergency Medicine

## 2016-10-17 ENCOUNTER — Emergency Department (HOSPITAL_COMMUNITY): Payer: Medicaid - Out of State

## 2016-10-17 ENCOUNTER — Encounter (HOSPITAL_COMMUNITY): Payer: Self-pay | Admitting: Emergency Medicine

## 2016-10-17 DIAGNOSIS — F17210 Nicotine dependence, cigarettes, uncomplicated: Secondary | ICD-10-CM | POA: Insufficient documentation

## 2016-10-17 DIAGNOSIS — J111 Influenza due to unidentified influenza virus with other respiratory manifestations: Secondary | ICD-10-CM | POA: Insufficient documentation

## 2016-10-17 DIAGNOSIS — R69 Illness, unspecified: Secondary | ICD-10-CM

## 2016-10-17 MED ORDER — ACETAMINOPHEN 500 MG PO TABS
1000.0000 mg | ORAL_TABLET | Freq: Once | ORAL | Status: AC
  Administered 2016-10-17: 1000 mg via ORAL
  Filled 2016-10-17: qty 2

## 2016-10-17 MED ORDER — OXYMETAZOLINE HCL 0.05 % NA SOLN: 1.0000 | Freq: Two times a day (BID) | NASAL | 0 refills | Status: AC

## 2016-10-17 MED ORDER — IBUPROFEN 800 MG PO TABS
800.0000 mg | ORAL_TABLET | Freq: Once | ORAL | Status: AC
  Administered 2016-10-17: 800 mg via ORAL
  Filled 2016-10-17: qty 1

## 2016-10-17 MED ORDER — BENZONATATE 100 MG PO CAPS: 200.0000 mg | ORAL_CAPSULE | Freq: Two times a day (BID) | ORAL | 0 refills | Status: AC | PRN

## 2016-10-17 NOTE — ED Notes (Signed)
Per PA, give ibuprofen 30 minutes after tylenol

## 2016-10-17 NOTE — ED Triage Notes (Signed)
Coughing and he coughs so hard his chest hurts

## 2016-10-17 NOTE — ED Notes (Signed)
Pt drinking ginger ale  

## 2016-10-17 NOTE — ED Notes (Signed)
Pt to xray

## 2016-10-17 NOTE — Discharge Instructions (Signed)
Take your medications as prescribed. I also recommend taking Tylenol and ibuprofen as prescribed over-the-counter, oximetry in between doses every 3-4 hours. Continue drinking fluids at home to remain hydrated. I recommend eating a bland diet for the next few days and taper symptoms have improved. Follow-up with your primary care provider in the next 3-4 days if symptoms have not improved. Return to the emergency department if symptoms worsen or new onset of headache, neck stiffness, difficulty breathing, coughing up blood, chest pain, abdominal pain, vomiting, unable to keep fluids down.   Your chest xray in the ED today also showed a incidental finding of a subtle nodularity which projects over the T10 vertebral body. Noncontrast chest CT scanning is recommended for further evaluation of the lung parenchyma. I recommend following up with your primary care provider in the next 2-3 weeks for follow up evaluation and further outpatient imaging.

## 2016-10-17 NOTE — ED Provider Notes (Signed)
[facility]-EMERGENCY DEPT Provider Note   CSN: [id number] Arrival date & time: 10/17/16  1331  By signing my name below, I, Majel Homer, attest that this documentation has been prepared under the direction and in the presence of Melburn Hake, PA-C . Electronically Signed: Majel Homer, Scribe. 10/17/2016. 2:28 PM.  History   Chief Complaint Chief Complaint  Patient presents with  . Cough  . Fever   The history is provided by the patient. No language interpreter was used.   HPI Comments: Donald Chung is a 41 y.o. male who presents to the Emergency Department complaining of gradually worsening, cough productive of "thick, yellow" sputum and fever (TMAX 103 in the ED) that began ~3 days ago. Pt reports associated rhinorrhea, congestion, chest pain secondary to his cough, shortness of breath, wheezing, chills, generalized body aches, and fatigue. He states he took 2 Aleve yesterday and 1 this morning without relief of his symptoms. He notes his twin brother at home had similar symptoms ~4 days ago in which he was seen in the ED and tested positive for influenza. Pt denies hemoptysis, sore throat, abdominal pain, nausea, vomiting, diarrhea, and dysuria. He states hx of smoking ~1 pack of cigarettes every day.   Past Medical History:  Diagnosis Date  . Bipolar disorder (HCC)   . PTSD (post-traumatic stress disorder)   . Self-mutilation     Patient Active Problem List   Diagnosis Date Noted  . Cocaine abuse with cocaine-induced mood disorder (HCC) 05/07/2016  . Polysubstance abuse   . Cannabis abuse, uncomplicated 11/08/2015  . Marijuana abuse 11/08/2015  . Drug abuse    Past Surgical History:  Procedure Laterality Date  . HIP SURGERY      Home Medications    Prior to Admission medications   Medication Sig Start Date End Date Taking? Authorizing Provider  benzonatate (TESSALON) 100 MG capsule Take 2 capsules (200 mg total) by mouth 2 (two) times daily as needed for cough. 10/17/16   Barrett Henle, PA-C  oxymetazoline (AFRIN NASAL SPRAY) 0.05 % nasal spray Place 1 spray into both nostrils 2 (two) times daily. Spray once into each nostril twice daily for up to the next 3 days. Do not use for more than 3 days to prevent rebound rhinorrhea. 10/17/16   Barrett Henle, PA-C  QUEtiapine (SEROQUEL) 200 MG tablet Take 1 tablet (200 mg total) by mouth at bedtime. 05/08/16   Charm Rings, NP    Family History No family history on file.  Social History Social History  Substance Use Topics  . Smoking status: Current Every Day Smoker    Packs/day: 1.00    Types: Cigarettes  . Smokeless tobacco: Never Used  . Alcohol use No     Allergies   Patient has no known allergies.   Review of Systems Review of Systems  Constitutional: Positive for chills, fatigue and fever.  HENT: Positive for congestion and rhinorrhea. Negative for sore throat.   Respiratory: Positive for cough, shortness of breath and wheezing.   Cardiovascular: Positive for chest pain (secondary to cough).  Gastrointestinal: Negative for abdominal pain, diarrhea, nausea and vomiting.  Genitourinary: Negative for dysuria.  Musculoskeletal: Positive for myalgias.   Physical Exam Updated Vital Signs BP 133/68 (BP Location: Right Arm)   Pulse 69   Temp 103 F (39.4 C) (Oral)   Resp 16   SpO2 100%   Physical Exam  Constitutional: He is oriented to person, place, and time. He appears well-developed and well-nourished.  HENT:  Head: Normocephalic and atraumatic.  Right Ear: Tympanic membrane normal.  Left Ear: Tympanic membrane normal.  Nose: Rhinorrhea present. Right sinus exhibits no maxillary sinus tenderness and no frontal sinus tenderness. Left sinus exhibits no maxillary sinus tenderness and no frontal sinus tenderness.  Mouth/Throat: Uvula is midline and mucous membranes are normal. No oropharyngeal exudate, posterior oropharyngeal edema, posterior oropharyngeal erythema or tonsillar  abscesses.  Eyes: Conjunctivae and EOM are normal. Right eye exhibits no discharge. Left eye exhibits no discharge. No scleral icterus.  Neck: Normal range of motion. Neck supple.  Cardiovascular: Normal rate, regular rhythm, normal heart sounds and intact distal pulses.   Pulmonary/Chest: Effort normal and breath sounds normal. No respiratory distress. He has no wheezes. He has no rales. He exhibits no tenderness.  Abdominal: Soft. Bowel sounds are normal. He exhibits no distension and no mass. There is no tenderness. There is no rebound and no guarding. No hernia.  Musculoskeletal: He exhibits no edema.  Lymphadenopathy:    He has no cervical adenopathy.  Neurological: He is alert and oriented to person, place, and time.  Skin: Skin is warm and dry.  Nursing note and vitals reviewed.  ED Treatments / Results  Labs (all labs ordered are listed, but only abnormal results are displayed) Labs Reviewed - No data to display  EKG  EKG Interpretation None       Radiology Dg Chest 2 View  Result Date: 10/17/2016 CLINICAL DATA:  Body aches, cough, fever, shortness of breath, and chest pain for the past 3 days. History of smoking, episodes of bronchitis, polysubstance abuse. EXAM: CHEST  2 VIEW COMPARISON:  None in PACs FINDINGS: The lungs are borderline hyperinflated with mild hemidiaphragm flattening. There is no focal infiltrate. There is no pleural effusion. The heart and pulmonary vascularity are normal. The mediastinum is normal in width. Subtle nodularity projects over the T9 vertebral body and is not clearly evident on the frontal view. IMPRESSION: Mild chronic bronchitic changes.  No acute pneumonia nor CHF. Subtle nodularity projects over the T10 vertebral body. Noncontrast chest CT scanning is recommended for further evaluation of the lung parenchyma. Electronically Signed   By: David  [place] M.D.   On: 10/17/2016 14:16   Procedures Procedures (including critical care  time)  Medications Ordered in ED Medications  ibuprofen (ADVIL,MOTRIN) tablet 800 mg (800 mg Oral Given 10/17/16 1458)  acetaminophen (TYLENOL) tablet 1,000 mg (1,000 mg Oral Given 10/17/16 1420)   DIAGNOSTIC STUDIES:  Oxygen Saturation is 100% on RA, normal by my interpretation.    COORDINATION OF CARE:  2:28 PM Discussed treatment plan with pt at bedside and pt agreed to plan.  Initial Impression / Assessment and Plan / ED Course  I have reviewed the triage vital signs and the nursing notes.  Pertinent labs & imaging results that were available during my care of the patient were reviewed by me and considered in my medical decision making (see chart for details).     Patient with symptoms consistent with influenza.  Vitals are stable,  Fever, pt given tylenol and ibuprofen in the ED.  No signs of dehydration, tolerating PO's.  Lungs are clear. CXR negative for infiltrate. Repeat vitals, showed temp 99.9.  Discussed the cost versus benefit of Tamiflu treatment with the patient.  The patient understands that symptoms are greater than the recommended 24-48 hour window of treatment.  Patient will be discharged with instructions to orally hydrate, rest, and use over-the-counter medications such as anti-inflammatories ibuprofen and Aleve  for muscle aches and Tylenol for fever.  Patient will also be given a cough suppressant and decongestant.  CXR showed incidental finding of subtle nodularity projects over the T10 vertebral body. Noncontrast chest CT scanning is recommended for further evaluation of the lung parenchyma. Discussed findings with pt and advised him to follow up with his PCP for outpatient follow up/imaging.   I personally performed the services described in this documentation, which was scribed in my presence. The recorded information has been reviewed and is accurate.   Final Clinical Impressions(s) / ED Diagnoses   Final diagnoses:  Influenza-like illness    New  Prescriptions New Prescriptions   BENZONATATE (TESSALON) 100 MG CAPSULE    Take 2 capsules (200 mg total) by mouth 2 (two) times daily as needed for cough.   OXYMETAZOLINE (AFRIN NASAL SPRAY) 0.05 % NASAL SPRAY    Place 1 spray into both nostrils 2 (two) times daily. Spray once into each nostril twice daily for up to the next 3 days. Do not use for more than 3 days to prevent rebound rhinorrhea.     Satira Sarkicole Elizabeth WestviewNadeau, [place] JerseyPA-C 10/17/16 1545    Charlynne Panderavid Hsienta Yao, MD 10/17/16 1600
